# Patient Record
Sex: Female | Born: 1935 | Race: White | Hispanic: No | Smoking: Former smoker
Health system: Southern US, Community
[De-identification: ages and names within clinical notes are randomized; demographics above are authoritative.]

## PROBLEM LIST (undated history)

## (undated) DIAGNOSIS — I10 Essential (primary) hypertension: Secondary | ICD-10-CM

## (undated) DIAGNOSIS — J449 Chronic obstructive pulmonary disease, unspecified: Secondary | ICD-10-CM

## (undated) DIAGNOSIS — E119 Type 2 diabetes mellitus without complications: Secondary | ICD-10-CM

## (undated) DIAGNOSIS — K219 Gastro-esophageal reflux disease without esophagitis: Secondary | ICD-10-CM

## (undated) HISTORY — PX: OTHER SURGICAL HISTORY: SHX169

## (undated) HISTORY — PX: BREAST SURGERY: SHX581

---

## 2008-01-07 ENCOUNTER — Ambulatory Visit: Payer: Self-pay | Admitting: Cardiology

## 2008-01-07 ENCOUNTER — Inpatient Hospital Stay (HOSPITAL_COMMUNITY): Admission: EM | Admit: 2008-01-07 | Discharge: 2008-01-12 | Payer: Self-pay | Admitting: Emergency Medicine

## 2008-01-10 ENCOUNTER — Encounter (INDEPENDENT_AMBULATORY_CARE_PROVIDER_SITE_OTHER): Payer: Self-pay | Admitting: *Deleted

## 2008-01-31 ENCOUNTER — Ambulatory Visit: Payer: Self-pay | Admitting: Internal Medicine

## 2008-02-07 ENCOUNTER — Ambulatory Visit: Payer: Self-pay | Admitting: Cardiovascular Disease

## 2008-02-21 ENCOUNTER — Ambulatory Visit: Payer: Self-pay | Admitting: Cardiology

## 2011-01-10 NOTE — Consult Note (Signed)
NAMEMarland Kitchen  Erika Steele, Erika Steele NO.:  [id number]   MEDICAL RECORD NO.:  [id number]           PATIENT TYPE:   LOCATION:                                 FACILITY:   PHYSICIAN:  Rollene Rotunda, MD, FACCDATE OF BIRTH:  [birth year]   DATE OF CONSULTATION:  DATE OF DISCHARGE:                                 CONSULTATION   CONSULTING PHYSICIAN:  [facility].   PRIMARY PHYSICIAN:  Dr. Wayna Chalet in [place], [place].   REASON FOR CONSULTATION:  Evaluate the patient with atrial fibrillation.   HISTORY OF PRESENT ILLNESS:  The patient is a lovely 75 year old white  female visiting here from [place].  She has no prior cardiac  history.  In retrospect, she does report palpitations going on for about  10 years.  She says these have been increasing in frequency over the  years.  She now gets them a couple of times a month.  She says that she  will get a rapid heartbeat that lasts for 20 minutes to an hour.  She  cannot bring these on.  They happen sporadically.  They happen at rest.  She describes a rapid rate but does not develop any presyncope or  syncope.  She does not have any chest discomfort.  She has not developed  any particular shortness of breath with these.  She did not seek medical  attention for these.   She is down here visiting her daughter.  She developed some slight cough  the day before admission.  She then developed some shortness of breath  with chills.  She presented to the hospital where she was noted to have  a temperature 101.9.  Chest x-ray demonstrated diffuse interstitial  markings and was thought possibly to be an atypical pneumonia.  Her BNP  was slightly elevated.  There was no other overt evidence for volume  overload.  She did have a slight elevated white blood cell count of  12.8.  She has been treated with bronchodilators and IV antibiotics.  She was admitted.  Around 8:30 last night, she developed atrial  fibrillation with rapid  rate.  A rapid response team was contacted.  The  patient was transferred to 3700, where her rate has been controlled with  Cardizem.  She is on heparin.  She says that she is noticing the  palpitations, but they are not particularly severe.  They are not as bad  as the episodes at home.  Again, she is not having any chest discomfort,  neck or arm discomfort.  She is not feeling presyncopal and has had no  syncope.  She thinks that the cough and shortness of breath is actually  little bit better since she was admitted.   The patient is not overly active at home.  She does some vacuuming.  She  cannot bring on in a cardiovascular symptoms with this.   PAST MEDICAL HISTORY:  1. Hypertension x5 years.  2. Restless leg syndrome.  3. Gastroesophageal reflux disease.  4. Obesity.  5. Sleep apnea.  6. Hyperlipidemia.   PAST SURGICAL HISTORY:  1. Left  ganglion cyst, resected.  2. Benign breast lump, resected.   ALLERGIES:  None.   MEDICATIONS:  1. Lisinopril (dose not clear).  2. Aspirin 81 mg daily.  3. ReQuip1 mg daily.  4. Protonix 40 mg daily.  5. Simvastatin 40 mg at bedtime.   SOCIAL HISTORY:  The patient lives in [place].  She lives alone.  She is divorced.  She has four children.  She is retired as a Diplomatic Services operational officer.  She quit smoking in December 2008, after 1-pack per day for 30 years.   FAMILY HISTORY:  Contributory for father dying of myocardial infarction  at age 97, dying of heart disease, and diabetes at age 74.  Prior to  this, he had two heart attacks.   REVIEW OF SYSTEMS:  As stated in the HPI, positive for snoring and  nocturia.  Negative for other systems.   PHYSICAL EXAMINATION:  GENERAL: Again, the patient is pleasant and in no  distress.  VITAL SIGNS: Blood pressure 100/64, heart rate 88 and regular,  temperature 99.1, and respiratory rate 18.  HEENT:  Eyes unremarkable.  Pupils are equal, round, and reactive to  light.  Fundi not visualized.  Oral  mucosa is unremarkable.  NECK: No jugular venous distention, 45 degrees.  Carotid upstroke brisk  and symmetric.  No bruits.  No thyromegaly.  LYMPHATICS: No cervical, axillary, or inguinal adenopathy.  LUNGS: Clear to few expiratory wheezes, no crackles.  BACK: No costovertebral angle tenderness.  CHEST:  Unremarkable.  HEART: PMI not displaced or sustained.  S1and S2 within normal limits.  No S3 and no murmurs.  ABDOMEN: Obese.  Positive bowel sounds.  Normal frequency and pitch.  No  bruits, rebound, or guarding.  No midline pulsatile mass, hepatomegaly,  or splenomegaly.  SKIN:  No rashes.  EXTREMITIES:  2+ pulse throughout.  No edema, cyanosis, or clubbing.  NEURO: Oriented to person, place, and time.  Cranial nerves II-XII  grossly intact.  Motor grossly intact.   EKG, atrial fibrillation, rate 120, axis within normal limits, intervals  within normal limits, and no acute ST-wave changes.   LABS:  WBC 12.8 and hemoglobin 13.1.  Sodium 137, potassium 3.7, BUN 10,  and creatinine 0.65.  CK-MB and troponin negative x2.   Chest x-ray, no air space disease or consolidation.  There is some  diffuse mild interstitial markings, questionable atypical pneumonia  versus vascular congestion.   ASSESSMENT AND PLAN:  1. Atrial fibrillation.  The patient is in atrial fibrillation      sustained with a rapid rate.  Rate is controlled with Cardizem.      She is on heparin.  I am going to start Coumadin in anticipation      that she may require at least short-term oral anticoagulation      therapy.  Then, I check an echocardiogram to see if she has any      high-risk features for thromboembolic disease.  She does have      hypertension and she is an older woman but does not have any      absolute indication for Coumadin at this point.  We will make a      long-term decision about the need for anticoagulation based on the      echo and whether she needs DC cardioversion prior to discharge.       She will have a TSH.  She will continue to have her pneumonia      treated.  She will  continue for now on the IV Cardizem.  She will      also continue on the heparin.  Of note, I do suspect that she has      had dysrhythmia off and on over the years, although I cannot      confirm this.  2. Obesity.  We will discuss with the patient either loose weight with      diet or exercise.  3. Hypertension.  Blood pressure will be controlled in the context of      treating her atrial fibrillation.  4. Pneumonia.  The patient will continue IV antibiotics and other      therapy per her primary team.  5. Dyslipidemia.  The patient will remain on simvastatin.      Rollene Rotunda, MD, [facility]  Electronically Signed     JH/MEDQ  D:  01/09/2008  T:  01/10/2008  Job:  [id number]

## 2011-01-10 NOTE — Discharge Summary (Signed)
Erika Steele, Erika Steele              ACCOUNT NO.:  [id number]   MEDICAL RECORD NO.:  [id number]          PATIENT TYPE:  INP   LOCATION:  3735                         FACILITY:  [facility]   PHYSICIAN:  Beckey Rutter, MD  DATE OF BIRTH:  [birth year]   DATE OF ADMISSION:  01/07/2008  DATE OF DISCHARGE:  01/12/2008                               DISCHARGE SUMMARY   PRIMARY CARE PHYSICIAN:  The patient is unassigned to [facility].   BRIEF HISTORY OF PRESENT ILLNESS:  This is a pleasant 75 year old  Caucasian female with past medical history significant for restless leg  syndrome, obesity, and obstructive sleep apnea presented because of  cough and fever.   HOSPITAL COURSE:  1. The patient was admitted to the hospital and was started on      Rocephin and Zithromax.  The patient has improvement in term of      fever and shortness of breath.  The patient is continued on the      antibiotic up-to-date, received 5 dose of Zithromax and Rocephin.      The patient is stable today to be released without antibiotic.  2. Atrial fibrillation.  During hospital course, the patient noticed      to have atrial fibrillation which is new.  The patient was seen by      Cardiology and she undergone cardioversion on Jan 10, 2008.  The      patient was kept on heparin drip since the discovery of this atrial      fibrillation.  The plan now is to discharge the patient on chronic      anticoagulation to follow up with Cardiology group or with her      primary physician.  Unfortunately, the patient does not have      primary physician here, but she wanted to be released today and she      promised to follow up with [facility] and [facility].  I will release the patient today to continue on      Lovenox 1.5 mg/kg as a breech and to continue on Coumadin 10 mg.      The patient will be discharged with home health visiting nurse.      The patient promised to follow up with Coumadin  Clinic starting      tomorrow.  She is stable for discharge.  3. Restless leg syndrome.  Remained stable.  The patient was continued      on ReQuip.  4. Obstructive sleep apnea.  Stable.  The patient continued on CPAP      during night.   DISCHARGE MEDICATIONS:  1. Coumadin 10 mg p.o. at bedtime.  2. Lovenox 160 mg subcutaneously daily for 2-3 days as a breech      administered by registered nurse, by HRN.  3. Lisinopril 40 mg daily.  4. ReQuip 1 mg twice a day.  5. Aspirin 81 mg daily.   DISCHARGE DIAGNOSES:  1. Pneumonia, resolved.  2. Atrial fibrillation, new onset status post cardioversion.  The      patient  on chronic anticoagulation now.   SECONDARY DIAGNOSES:  1. Obstructive sleep apnea.  2. Hypertension.  3. Restless leg syndrome.   DISCHARGE DATA:  INR today is 1.1, white blood count 7.3, hemoglobin is  12.1, hematocrit 35.9, and platelet count is 205,000.  A 2-D echo done  on Jan 10, 2008.   IMPRESSION:  Overall left ventricular systolic function was normal.  Left ventricular ejection fraction was estimated range being 65% to 70%.  Left ventricular wall thickness was mildly increased.   HOSPITAL CONSULTATION:  Cardiology consultation done by Dr. Rollene Rotunda.   DISCHARGE PLAN:  As discussed above.  The patient was discharged to  follow up with the Coumadin clinic.  I had several discussions with her  regarding the discharge plan, because she does not have a primary  physician in our area.  The patient is aware and agreeable to the  discharge plan.      Beckey Rutter, MD  Electronically Signed     EME/MEDQ  D:  01/12/2008  T:  01/12/2008  Job:  [id number]

## 2011-01-10 NOTE — H&P (Signed)
Erika Steele, Erika Steele NO.:  [id number]   MEDICAL RECORD NO.:  [id number]          PATIENT TYPE:  INP   LOCATION:  5502                         FACILITY:  [facility]   PHYSICIAN:  Della Goo, M.D. DATE OF BIRTH:  November 28, 1935   DATE OF ADMISSION:  01/07/2008  DATE OF DISCHARGE:                              HISTORY & PHYSICAL   PRIMARY CARE PHYSICIAN:  Unassigned.   CHIEF COMPLAINT:  Cough, fever.   HISTORY OF PRESENT ILLNESS:  This is a 75 year old female presenting to  the emergency department with complaints of rhinitis, cough and chest  congestion for the past 24 hours. She reports having fevers, chills and  myalgias.  She also reports having a temperature to 101.9.  She has had  a nonproductive cough.   PAST MEDICAL HISTORY:  1. Hypertension.  2. Restless leg syndrome.  3. Gastroesophageal reflux disease.   PAST SURGICAL HISTORY:  1. History of a ganglion cyst removal, left wrist.  2. Benign breast biopsy of a left breast mass.   MEDICATIONS:  Include aspirin, lisinopril, ropinirole (Requip),  simvastatin and Protonix.   ALLERGIES:  NO KNOWN DRUG ALLERGIES.   SOCIAL HISTORY:  The patient is a nonsmoker and she reports being an  occasional drinker.   FAMILY HISTORY:  Noncontributory.   REVIEW OF SYSTEMS:  Pertinents mentioned above.   PHYSICAL EXAMINATION FINDINGS:  This is an obese 75 year old female in  discomfort but no acute distress.  Her temperature is 101.9, blood  pressure 176/82, heart rate 76, respirations 18, O2 saturations 92% on  room air.  HEENT:  Normocephalic, atraumatic.  Pupils equally round and reactive to  light.  Extraocular muscles are intact.  Funduscopic reveals small  speckled cataract formation.  Oropharynx is clear.  NECK:  Supple, full range of motion.  No thyromegaly, adenopathy or  jugular venous distention.  CARDIOVASCULAR:  Regular rate and rhythm.  No murmurs, gallops or rubs.  LUNGS:  Clear to auscultation  bilaterally.  ABDOMEN:  Positive bowel sounds; soft, nontender, nondistended.  EXTREMITIES:  Without cyanosis, clubbing or edema.  NEUROLOGIC:  Alert and oriented x3.  There are no focal deficits.   LABORATORY STUDIES:  White blood cell count 12.8, hemoglobin 13.1,  hematocrit 38.9, platelets 216, neutrophils 80%, lymphocytes 12%.  Chest  x-ray findings reveal diffuse interstitial markings, consistent with an  atypical pneumonia.   ASSESSMENT:  A 75 year old female being admitted with:  1. Atypical pneumonia.  2. Shortness of breath.  3. Hypertension.   PLAN:  The patient will be admitted and has been placed on IV antibiotic  therapy of azithromycin to cover atypicals.  Nebulizer treatments have  been ordered along with supplemental oxygen.  She will continue on her  regular medications, once they have been verified.  DVT and GI  prophylaxis have also been ordered.      Della Goo, M.D.  Electronically Signed     HJ/MEDQ  D:  01/08/2008  T:  01/08/2008  Job:  [id number]

## 2011-01-10 NOTE — Assessment & Plan Note (Signed)
Erika Steele                            CARDIOLOGY OFFICE NOTE   NAME:Steele, Erika                       MRN:          [id number]  DATE:01/31/2008                            DOB:          1936/03/07    IDENTIFICATION:  Erika Steele is a 75 year old woman who was recently  discharged from [facility].  She was admitted with pneumonia and  found to be in atrial fibrillation.   The patient was seen by the Cardiology Service, and she underwent  cardioversion on Jan 10, 2008.  She was placed on Lovenox and then  Coumadin therapy.  She went home on this and since has been on Coumadin.   Since discharge, pneumonia symptoms have resolved.  She has noted a  couple of times episodes of palpitations, one was 2 weeks ago and the  other was this past week from about 8 p.m. to midnight.  She noticed her  heart was skipping, pounding actually.  She denied dizziness.  Waking  up, she said she just felt tired, but back in the normal rhythm she  felt.   ALLERGIES:  None.   MEDICATIONS:  1. Coumadin.  2. Lisinopril 10 mg.  3. Ropinirole 1 mg b.i.d.  4. Simvastatin 40 mg.  5. Aspirin 81 mg.  6. Protonix 40 mg.   PAST MEDICAL HISTORY:  1. Hypertension.  2. Restless leg syndrome.  3. GE reflux.  4. Obesity.  5. Sleep apnea.  6. Dyslipidemia.   SOCIAL HISTORY:  The patient is visiting daughter from [place],  soon to go back in the next few weeks.   PHYSICAL EXAMINATION:  GENERAL:  On exam, the patient is in no distress.  VITAL SIGNS:  Blood pressure 115/70, pulse is 63 and regular, and weight  237.  LUNGS:  Clear.  CARDIAC:  Regular rate and rhythm.  S1 and S2.  No S3.  No significant  murmurs.  ABDOMEN:  Benign.  EXTREMITIES:  No edema.   A 12-lead EKG, normal sinus rhythm, 63 beats per minute.   IMPRESSION:  1. Atrial fibrillation.  Currently in sinus rhythm.  It sounds like      the majority of time she has been in sinus rhythm.  She is  status      post recent cardioversion and it  has been important to maintain      Coumadin therapy.  Because of this with her age and being a female,      and her history of hypertension, I would recommend probably staying      on Coumadin therapy.      a.     The patient has had a couple episodes, it sounds like she       has tolerated them fairly well.  Of course, if she has more       episodes or if they become more debilitating, antiarrhythmic       therapy could be considered but I do not think this is indicated       at present.      b.  The patient is due to be moved back to her home in [place].  I will make copies for her to give to her primary       physician, and would recommend that she set up contact with a       cardiologist locally.  2. Hypertension, adequate control.  3. Dyslipidemia, on statin therapy.  4. History of sleep apnea.  The patient wears continuous positive      airway pressure at night.   The patient will continue to follow up in Coumadin Clinic while she is  here, her counts have been somewhat labile.      Pricilla Riffle, MD, [facility]  Electronically Signed    PVR/MedQ  DD: 02/03/2008  DT: 02/03/2008  Job #: [id number]

## 2011-05-24 LAB — HEPARIN LEVEL (UNFRACTIONATED): Heparin Unfractionated: 0.32

## 2011-05-24 LAB — PROTIME-INR
INR: 1
INR: 1.1
Prothrombin Time: 13
Prothrombin Time: 13.2
Prothrombin Time: 14.8

## 2011-05-24 LAB — CBC
HCT: 37
MCV: 91.5
Platelets: 205
Platelets: 207
RBC: 4.34
RDW: 13.5
WBC: 7
WBC: 8.6

## 2011-05-24 LAB — CARDIAC PANEL(CRET KIN+CKTOT+MB+TROPI): Relative Index: INVALID

## 2014-10-22 ENCOUNTER — Encounter (HOSPITAL_COMMUNITY): Payer: Self-pay

## 2014-10-22 ENCOUNTER — Emergency Department (HOSPITAL_COMMUNITY): Payer: Medicare Other

## 2014-10-22 ENCOUNTER — Emergency Department (HOSPITAL_COMMUNITY)
Admission: EM | Admit: 2014-10-22 | Discharge: 2014-10-22 | Disposition: A | Discharge status: Home / Self Care | Payer: Medicare Other | Attending: Emergency Medicine | Admitting: Emergency Medicine

## 2014-10-22 DIAGNOSIS — I10 Essential (primary) hypertension: Secondary | ICD-10-CM | POA: Diagnosis not present

## 2014-10-22 DIAGNOSIS — Z8719 Personal history of other diseases of the digestive system: Secondary | ICD-10-CM | POA: Diagnosis not present

## 2014-10-22 DIAGNOSIS — R0602 Shortness of breath: Secondary | ICD-10-CM | POA: Diagnosis present

## 2014-10-22 DIAGNOSIS — E119 Type 2 diabetes mellitus without complications: Secondary | ICD-10-CM | POA: Diagnosis not present

## 2014-10-22 DIAGNOSIS — J441 Chronic obstructive pulmonary disease with (acute) exacerbation: Secondary | ICD-10-CM | POA: Insufficient documentation

## 2014-10-22 DIAGNOSIS — Z87891 Personal history of nicotine dependence: Secondary | ICD-10-CM | POA: Diagnosis not present

## 2014-10-22 HISTORY — DX: Type 2 diabetes mellitus without complications: E11.9

## 2014-10-22 HISTORY — DX: Gastro-esophageal reflux disease without esophagitis: K21.9

## 2014-10-22 HISTORY — DX: Essential (primary) hypertension: I10

## 2014-10-22 HISTORY — DX: Chronic obstructive pulmonary disease, unspecified: J44.9

## 2014-10-22 LAB — BASIC METABOLIC PANEL
Anion gap: 7 (ref 5–15)
BUN: 14 mg/dL (ref 6–23)
CHLORIDE: 106 mmol/L (ref 96–112)
CO2: 25 mmol/L (ref 19–32)
Calcium: 9.1 mg/dL (ref 8.4–10.5)
Creatinine, Ser: 0.8 mg/dL (ref 0.50–1.10)
GFR calc Af Amer: 81 mL/min — ABNORMAL LOW (ref >90)
GFR, EST NON AFRICAN AMERICAN: 70 mL/min — AB (ref >90)
GLUCOSE: 200 mg/dL — AB (ref 70–99)
POTASSIUM: 4.1 mmol/L (ref 3.5–5.1)
SODIUM: 138 mmol/L (ref 135–145)

## 2014-10-22 LAB — CBC
HEMATOCRIT: 41.5 % (ref 36.0–46.0)
HEMOGLOBIN: 13.4 g/dL (ref 12.0–15.0)
MCH: 30.1 pg (ref 26.0–34.0)
MCHC: 32.3 g/dL (ref 30.0–36.0)
MCV: 93.3 fL (ref 78.0–100.0)
Platelets: 181 10*3/uL (ref 150–400)
RBC: 4.45 MIL/uL (ref 3.87–5.11)
RDW: 13.4 % (ref 11.5–15.5)
WBC: 8.9 10*3/uL (ref 4.0–10.5)

## 2014-10-22 LAB — BRAIN NATRIURETIC PEPTIDE: B NATRIURETIC PEPTIDE 5: 131.8 pg/mL — AB (ref 0.0–100.0)

## 2014-10-22 LAB — I-STAT TROPONIN, ED: Troponin i, poc: 0.01 ng/mL (ref 0.00–0.08)

## 2014-10-22 MED ORDER — IPRATROPIUM BROMIDE 0.02 % IN SOLN
0.5000 mg | Freq: Once | RESPIRATORY_TRACT | Status: AC
  Administered 2014-10-22: 0.5 mg via RESPIRATORY_TRACT
  Filled 2014-10-22: qty 2.5

## 2014-10-22 MED ORDER — IPRATROPIUM-ALBUTEROL 0.5-2.5 (3) MG/3ML IN SOLN
3.0000 mL | Freq: Once | RESPIRATORY_TRACT | Status: AC
  Administered 2014-10-22: 3 mL via RESPIRATORY_TRACT

## 2014-10-22 MED ORDER — ALBUTEROL SULFATE HFA 108 (90 BASE) MCG/ACT IN AERS: 1.0000 | INHALATION_SPRAY | Freq: Four times a day (QID) | RESPIRATORY_TRACT | Status: AC | PRN

## 2014-10-22 MED ORDER — PREDNISONE 20 MG PO TABS
60.0000 mg | ORAL_TABLET | Freq: Once | ORAL | Status: AC
  Administered 2014-10-22: 60 mg via ORAL
  Filled 2014-10-22: qty 3

## 2014-10-22 MED ORDER — IPRATROPIUM-ALBUTEROL 0.5-2.5 (3) MG/3ML IN SOLN
RESPIRATORY_TRACT | Status: AC
  Administered 2014-10-22: 22:00:00
  Filled 2014-10-22: qty 3

## 2014-10-22 MED ORDER — IPRATROPIUM BROMIDE 0.02 % IN SOLN
RESPIRATORY_TRACT | Status: AC
  Administered 2014-10-22: 22:00:00
  Filled 2014-10-22: qty 2.5

## 2014-10-22 MED ORDER — PREDNISONE 20 MG PO TABS: 60.0000 mg | ORAL_TABLET | Freq: Once | ORAL | Status: AC

## 2014-10-22 MED ORDER — ALBUTEROL (5 MG/ML) CONTINUOUS INHALATION SOLN
10.0000 mg/h | INHALATION_SOLUTION | Freq: Once | RESPIRATORY_TRACT | Status: AC
  Administered 2014-10-22: 10 mg/h via RESPIRATORY_TRACT
  Filled 2014-10-22: qty 20

## 2014-10-22 NOTE — Discharge Instructions (Signed)

## 2014-10-22 NOTE — ED Notes (Signed)
Pt reporting shortness of breath x 1 week.  Pt with hx of COPD.  Used inhaler prior to arrival.  O2 sats initially 86% but improved to 93% on RA. Pt with productive cough.

## 2014-10-22 NOTE — ED Provider Notes (Signed)
CSN: [id number]     Arrival date & time 10/22/14  2045 History   First MD Initiated Contact with Patient 10/22/14 2140     Chief Complaint  Patient presents with  . Shortness of Breath     (Consider location/radiation/quality/duration/timing/severity/associated sxs/prior Treatment) Patient is a 79 y.o. female presenting with shortness of breath. The history is provided by the patient.  Shortness of Breath Severity:  Mild Onset quality:  Gradual Duration:  1 week Timing:  Constant Progression:  Worsening Chronicity:  Recurrent Context: weather changes   Relieved by:  Nothing Worsened by:  Nothing tried Ineffective treatments:  Inhaler Associated symptoms: no abdominal pain, no chest pain, no cough, no fever and no vomiting     Past Medical History  Diagnosis Date  . COPD (chronic obstructive pulmonary disease)   . Hypertension   . GERD (gastroesophageal reflux disease)   . Diabetes mellitus without complication    Past Surgical History  Procedure Laterality Date  . Arm surgery    . Breast surgery     History reviewed. No pertinent family history. History  Substance Use Topics  . Smoking status: Former Games developer  . Smokeless tobacco: Not on file  . Alcohol Use: No   OB History    No data available     Review of Systems  Constitutional: Negative for fever.  Respiratory: Negative for cough and shortness of breath.   Cardiovascular: Negative for chest pain and leg swelling.  Gastrointestinal: Negative for vomiting and abdominal pain.  All other systems reviewed and are negative.     Allergies  Review of patient's allergies indicates not on file.  Home Medications   Prior to Admission medications   Not on File   BP 151/105 mmHg  Pulse 111  Temp(Src) 98.3 F (36.8 C) (Oral)  Resp 20  SpO2 93% Physical Exam  Constitutional: She is oriented to person, place, and time. She appears well-developed and well-nourished. No distress.  HENT:  Head: Normocephalic  and atraumatic.  Mouth/Throat: Oropharynx is clear and moist.  Eyes: EOM are normal. Pupils are equal, round, and reactive to light.  Neck: Normal range of motion. Neck supple.  Cardiovascular: Normal rate and regular rhythm.  Exam reveals no friction rub.   No murmur heard. Pulmonary/Chest: Effort normal. No respiratory distress. She has wheezes (all fields, mild, expiratory, polyphonic). She has no rales.  Abdominal: Soft. She exhibits no distension. There is no tenderness. There is no rebound.  Musculoskeletal: Normal range of motion. She exhibits no edema.  Neurological: She is alert and oriented to person, place, and time. No cranial nerve deficit. She exhibits normal muscle tone. Coordination normal.  Skin: No rash noted. She is not diaphoretic.  Nursing note and vitals reviewed.   ED Course  Procedures (including critical care time) Labs Review Labs Reviewed  BASIC METABOLIC PANEL - Abnormal; Notable for the following:    Glucose, Bld 200 (*)    GFR calc non Af Amer 70 (*)    GFR calc Af Amer 81 (*)    All other components within normal limits  CBC  BRAIN NATRIURETIC PEPTIDE  I-STAT TROPOININ, ED    Imaging Review Dg Chest 2 View (if Patient Has Fever And/or Copd)  10/22/2014   CLINICAL DATA:  COPD. Shortness of breath for 1 week. Diabetes. Hypertension.  EXAM: CHEST  2 VIEW  COMPARISON:  01/07/2008  FINDINGS: Mild cardiac enlargement with mild pulmonary vascular congestion. Diffuse interstitial pattern suggest early interstitial edema. No focal consolidation.  No blunting of costophrenic angles. No pneumothorax. Degenerative changes in the spine. Mild anterior wedging of mid thoracic vertebra, unchanged since prior study.  IMPRESSION: Mild cardiac enlargement and pulmonary vascular congestion with mild interstitial edema.   Electronically Signed   By: Burman NievesWilliam  Stevens M.D.   On: 10/22/2014 21:26     EKG Interpretation   Date/Time:  Thursday October 22 2014 20:54:37  EST Ventricular Rate:  101 PR Interval:    QRS Duration: 138 QT Interval:  382 QTC Calculation: 495 R Axis:   -64 Text Interpretation:  Atrial fibrillation with rapid ventricular response  Left axis deviation Right bundle branch block Abnormal ECG RBBB new, Afib  old Confirmed by Gwendolyn GrantWALDEN  MD, Jaiyanna Safran ([id number]) on 10/22/2014 9:51:04 PM      MDM   Final diagnoses:  COPD exacerbation    77F with hx fo COPD presents with difficulty breathing. Present for the past week, but was relieved with her home inhalers. Inhalers didn't provide any relief today. No fever. Mild cough, but nonproductive.  CXR shows some interstitial edema, will check BNP. No pneumonia identified. No white count. Diffuse wheezing, mostly expiratory, polyphonic. Will give another breathing treatment and steroids. BNP normal. Feeling better after hour long continuous albuterol. Opening up with more wheezing, improving.  Stable for discharge.    Elwin MochaBlair Olla Delancey, MD 10/22/14 [phone]

## 2015-09-12 IMAGING — DX DG CHEST 2V
2 series · 2 of 2 positions shown · non-contrast
Comparison: 01/07/2008

CLINICAL DATA: COPD. Shortness of breath for 1 week. Diabetes.
Hypertension.

EXAM:
CHEST  2 VIEW

[chest pa]
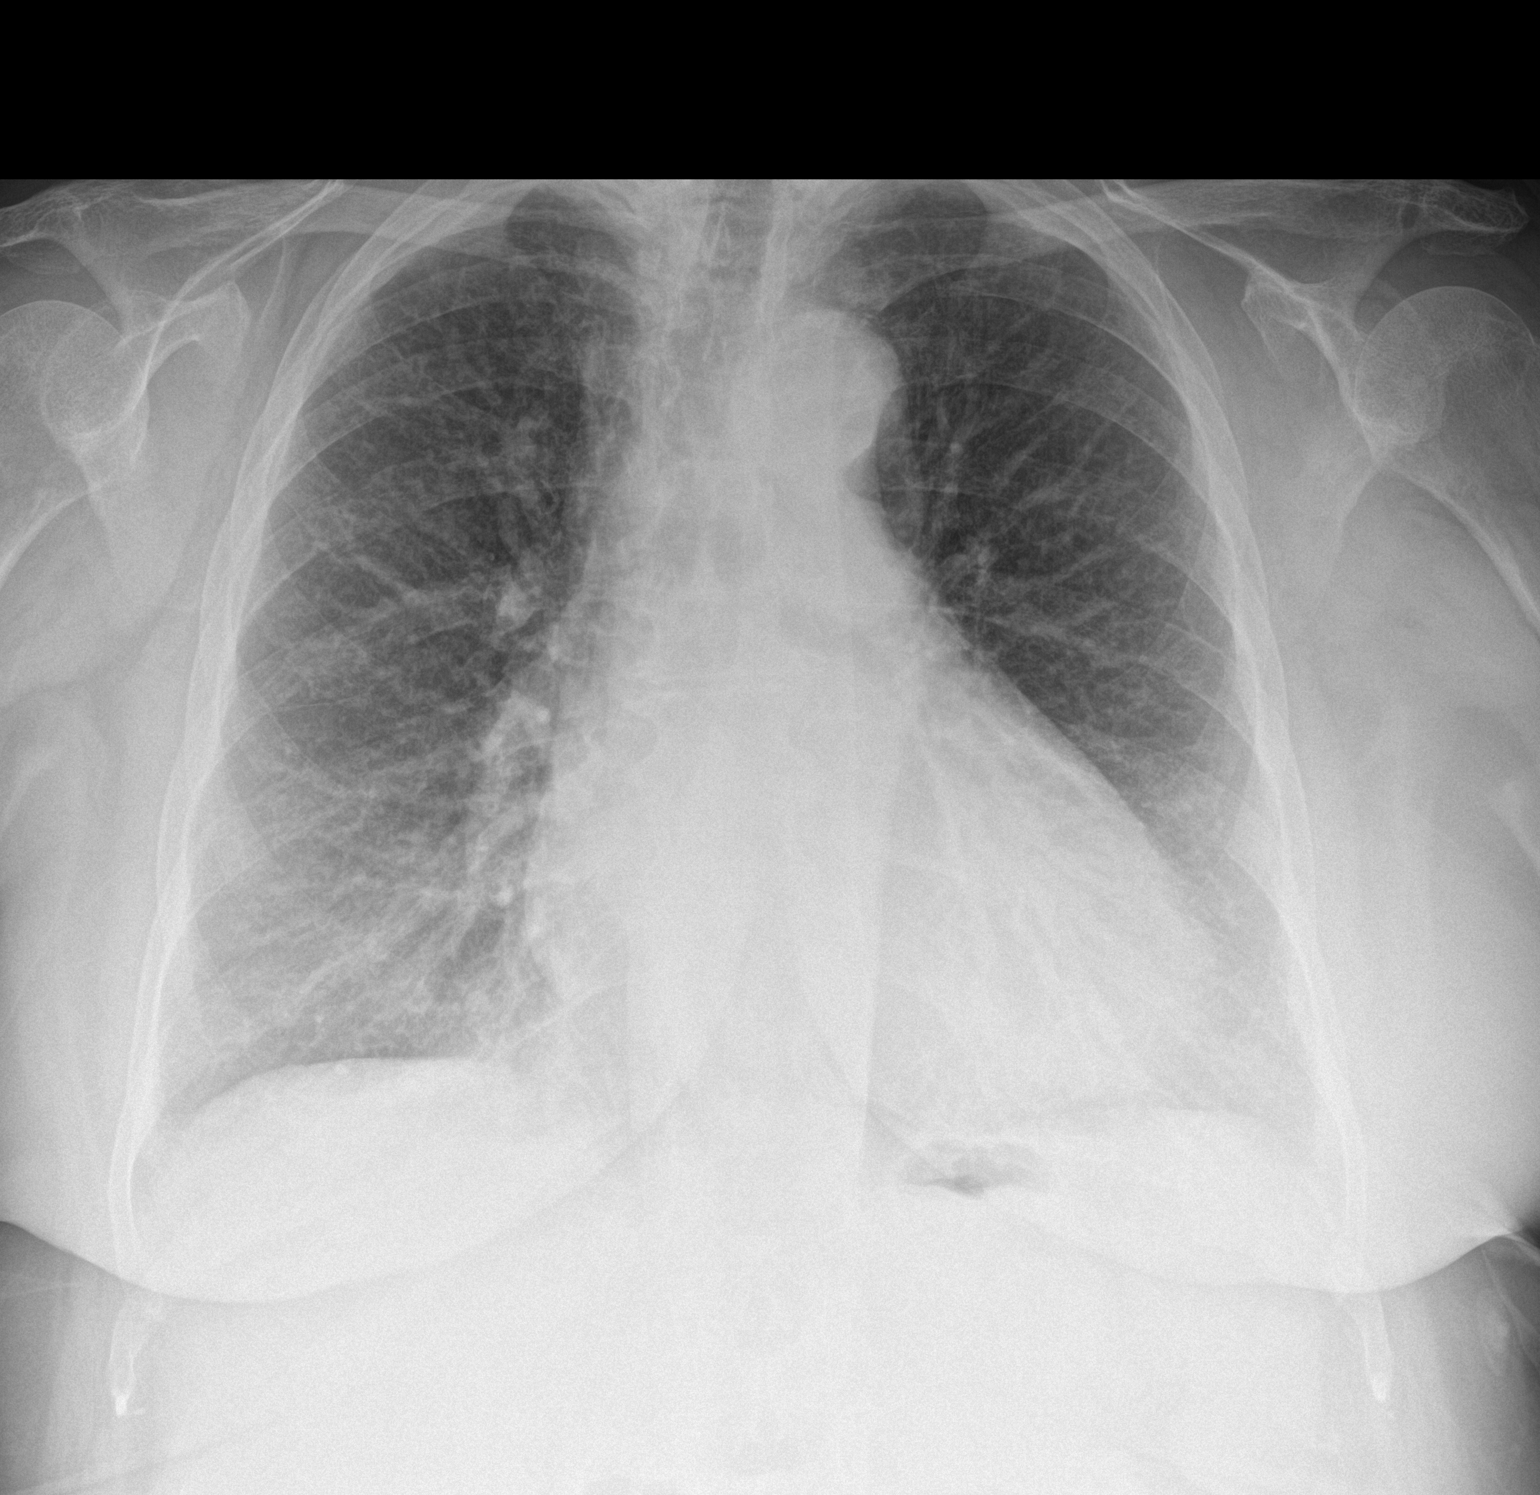

[chest lat]
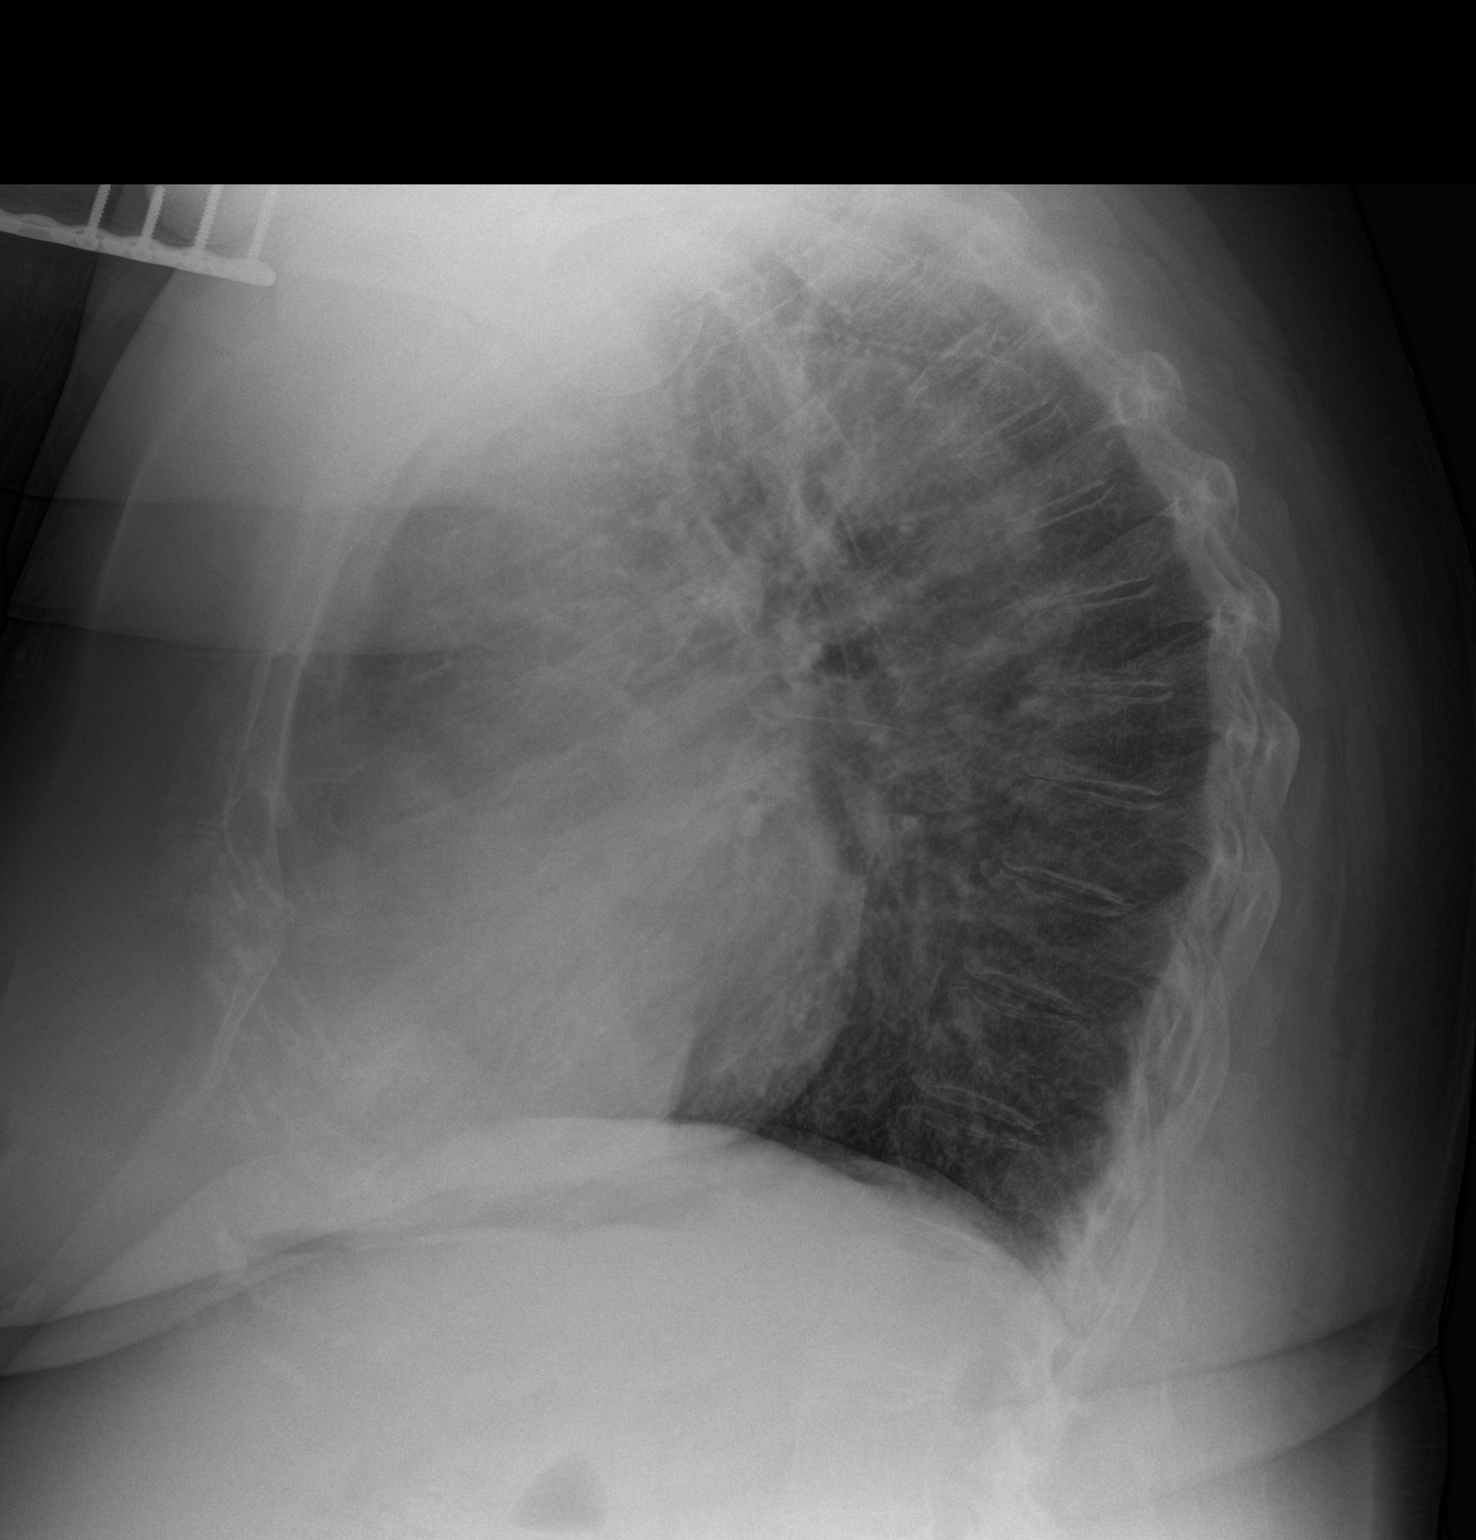

[2 of 2 positions shown; findings below may reference images not displayed]

FINDINGS: Mild cardiac enlargement with mild pulmonary vascular congestion.
Diffuse interstitial pattern suggest early interstitial edema. No
focal consolidation. No blunting of costophrenic angles. No
pneumothorax. Degenerative changes in the spine. Mild anterior
wedging of mid thoracic vertebra, unchanged since prior study.
IMPRESSION: Mild cardiac enlargement and pulmonary vascular congestion with mild
interstitial edema.
